# Patient Record
Sex: Female | Born: 1955 | Race: White | Hispanic: No | Marital: Married | Smoking: Never smoker
Health system: Southern US, Community
[De-identification: ages and names within clinical notes are randomized; demographics above are authoritative.]

## PROBLEM LIST (undated history)

## (undated) DIAGNOSIS — M8580 Other specified disorders of bone density and structure, unspecified site: Secondary | ICD-10-CM

## (undated) DIAGNOSIS — Z853 Personal history of malignant neoplasm of breast: Secondary | ICD-10-CM

## (undated) DIAGNOSIS — N879 Dysplasia of cervix uteri, unspecified: Secondary | ICD-10-CM

## (undated) DIAGNOSIS — C801 Malignant (primary) neoplasm, unspecified: Secondary | ICD-10-CM

## (undated) DIAGNOSIS — J301 Allergic rhinitis due to pollen: Secondary | ICD-10-CM

## (undated) DIAGNOSIS — Z85850 Personal history of malignant neoplasm of thyroid: Secondary | ICD-10-CM

## (undated) HISTORY — DX: Allergic rhinitis due to pollen: J30.1

## (undated) HISTORY — DX: Other specified disorders of bone density and structure, unspecified site: M85.80

## (undated) HISTORY — DX: Personal history of malignant neoplasm of thyroid: Z85.850

## (undated) HISTORY — DX: Dysplasia of cervix uteri, unspecified: N87.9

## (undated) HISTORY — PX: DILATION AND CURETTAGE OF UTERUS: SHX78

## (undated) HISTORY — DX: Personal history of malignant neoplasm of breast: Z85.3

## (undated) HISTORY — PX: NECK SURGERY: SHX720

---

## 1999-09-26 ENCOUNTER — Other Ambulatory Visit: Admission: RE | Admit: 1999-09-26 | Discharge: 1999-09-26 | Payer: Self-pay | Admitting: *Deleted

## 1999-10-05 ENCOUNTER — Encounter: Payer: Self-pay | Admitting: *Deleted

## 1999-10-05 ENCOUNTER — Encounter: Admission: RE | Admit: 1999-10-05 | Discharge: 1999-10-05 | Payer: Self-pay | Admitting: *Deleted

## 2000-10-03 ENCOUNTER — Other Ambulatory Visit: Admission: RE | Admit: 2000-10-03 | Discharge: 2000-10-03 | Payer: Self-pay | Admitting: *Deleted

## 2002-05-21 ENCOUNTER — Encounter: Admission: RE | Admit: 2002-05-21 | Discharge: 2002-05-21 | Payer: Self-pay | Admitting: Radiation Oncology

## 2002-10-09 ENCOUNTER — Other Ambulatory Visit: Admission: RE | Admit: 2002-10-09 | Discharge: 2002-10-09 | Payer: Self-pay | Admitting: Family Medicine

## 2003-05-05 ENCOUNTER — Other Ambulatory Visit: Admission: RE | Admit: 2003-05-05 | Discharge: 2003-05-05 | Payer: Self-pay | Admitting: Family Medicine

## 2003-11-11 ENCOUNTER — Other Ambulatory Visit: Admission: RE | Admit: 2003-11-11 | Discharge: 2003-11-11 | Payer: Self-pay | Admitting: Family Medicine

## 2004-03-10 ENCOUNTER — Encounter: Admission: RE | Admit: 2004-03-10 | Discharge: 2004-03-10 | Payer: Self-pay | Admitting: Family Medicine

## 2005-01-19 ENCOUNTER — Other Ambulatory Visit: Admission: RE | Admit: 2005-01-19 | Discharge: 2005-01-19 | Payer: Self-pay | Admitting: Family Medicine

## 2005-02-12 HISTORY — PX: THYROIDECTOMY: SHX17

## 2005-04-26 DIAGNOSIS — N879 Dysplasia of cervix uteri, unspecified: Secondary | ICD-10-CM

## 2005-04-26 HISTORY — DX: Dysplasia of cervix uteri, unspecified: N87.9

## 2005-05-21 ENCOUNTER — Encounter: Admission: RE | Admit: 2005-05-21 | Discharge: 2005-05-21 | Payer: Self-pay | Admitting: Family Medicine

## 2005-05-23 ENCOUNTER — Ambulatory Visit (HOSPITAL_COMMUNITY): Admission: RE | Admit: 2005-05-23 | Discharge: 2005-05-23 | Payer: Self-pay | Admitting: Family Medicine

## 2005-06-05 ENCOUNTER — Encounter (HOSPITAL_COMMUNITY): Admission: RE | Admit: 2005-06-05 | Discharge: 2005-09-03 | Payer: Self-pay | Admitting: Family Medicine

## 2005-06-11 ENCOUNTER — Emergency Department (HOSPITAL_COMMUNITY): Admission: EM | Admit: 2005-06-11 | Discharge: 2005-06-11 | Payer: Self-pay | Admitting: Emergency Medicine

## 2005-08-20 ENCOUNTER — Other Ambulatory Visit: Admission: RE | Admit: 2005-08-20 | Discharge: 2005-08-20 | Payer: Self-pay | Admitting: Family Medicine

## 2005-10-12 ENCOUNTER — Encounter: Admission: RE | Admit: 2005-10-12 | Discharge: 2005-10-12 | Payer: Self-pay | Admitting: Otolaryngology

## 2005-10-22 ENCOUNTER — Other Ambulatory Visit: Admission: RE | Admit: 2005-10-22 | Discharge: 2005-10-22 | Payer: Self-pay | Admitting: Otolaryngology

## 2006-02-20 ENCOUNTER — Other Ambulatory Visit: Admission: RE | Admit: 2006-02-20 | Discharge: 2006-02-20 | Payer: Self-pay | Admitting: Family Medicine

## 2006-05-22 ENCOUNTER — Ambulatory Visit (HOSPITAL_COMMUNITY): Admission: RE | Admit: 2006-05-22 | Discharge: 2006-05-22 | Payer: Self-pay | Admitting: Obstetrics and Gynecology

## 2006-06-17 ENCOUNTER — Encounter: Admission: RE | Admit: 2006-06-17 | Discharge: 2006-06-17 | Payer: Self-pay | Admitting: Family Medicine

## 2006-10-02 ENCOUNTER — Encounter (INDEPENDENT_AMBULATORY_CARE_PROVIDER_SITE_OTHER): Payer: Self-pay | Admitting: Obstetrics and Gynecology

## 2006-10-02 ENCOUNTER — Ambulatory Visit (HOSPITAL_COMMUNITY): Admission: RE | Admit: 2006-10-02 | Discharge: 2006-10-02 | Payer: Self-pay | Admitting: Obstetrics and Gynecology

## 2007-01-30 ENCOUNTER — Other Ambulatory Visit: Admission: RE | Admit: 2007-01-30 | Discharge: 2007-01-30 | Payer: Self-pay | Admitting: Obstetrics and Gynecology

## 2007-06-26 ENCOUNTER — Other Ambulatory Visit: Admission: RE | Admit: 2007-06-26 | Discharge: 2007-06-26 | Payer: Self-pay | Admitting: Obstetrics and Gynecology

## 2007-06-26 ENCOUNTER — Encounter: Admission: RE | Admit: 2007-06-26 | Discharge: 2007-06-26 | Payer: Self-pay | Admitting: Family Medicine

## 2007-07-04 ENCOUNTER — Encounter: Admission: RE | Admit: 2007-07-04 | Discharge: 2007-07-04 | Payer: Self-pay | Admitting: Family Medicine

## 2007-10-28 ENCOUNTER — Other Ambulatory Visit: Admission: RE | Admit: 2007-10-28 | Discharge: 2007-10-28 | Payer: Self-pay | Admitting: Obstetrics and Gynecology

## 2008-04-30 ENCOUNTER — Other Ambulatory Visit: Admission: RE | Admit: 2008-04-30 | Discharge: 2008-04-30 | Payer: Self-pay | Admitting: Obstetrics and Gynecology

## 2009-02-12 DIAGNOSIS — Z853 Personal history of malignant neoplasm of breast: Secondary | ICD-10-CM

## 2009-02-12 HISTORY — DX: Personal history of malignant neoplasm of breast: Z85.3

## 2009-02-12 HISTORY — PX: MASTECTOMY: SHX3

## 2009-02-15 ENCOUNTER — Encounter: Admission: RE | Admit: 2009-02-15 | Discharge: 2009-02-15 | Payer: Self-pay | Admitting: Obstetrics and Gynecology

## 2009-02-25 ENCOUNTER — Encounter: Admission: RE | Admit: 2009-02-25 | Discharge: 2009-02-25 | Payer: Self-pay | Admitting: Obstetrics and Gynecology

## 2009-03-03 ENCOUNTER — Ambulatory Visit: Payer: Self-pay | Admitting: Oncology

## 2009-03-04 ENCOUNTER — Encounter: Admission: RE | Admit: 2009-03-04 | Discharge: 2009-03-04 | Payer: Self-pay | Admitting: Obstetrics and Gynecology

## 2009-03-07 LAB — COMPREHENSIVE METABOLIC PANEL
ALT: 13 U/L (ref 0–35)
Albumin: 4.5 g/dL (ref 3.5–5.2)
BUN: 12 mg/dL (ref 6–23)
CO2: 26 mEq/L (ref 19–32)
Calcium: 9.9 mg/dL (ref 8.4–10.5)
Creatinine, Ser: 0.88 mg/dL (ref 0.40–1.20)
Total Bilirubin: 0.5 mg/dL (ref 0.3–1.2)
Total Protein: 7.1 g/dL (ref 6.0–8.3)

## 2009-03-07 LAB — CBC WITH DIFFERENTIAL/PLATELET
BASO%: 0.3 % (ref 0.0–2.0)
HCT: 37.4 % (ref 34.8–46.6)
HGB: 12.7 g/dL (ref 11.6–15.9)
LYMPH%: 25 % (ref 14.0–49.7)
MCHC: 33.9 g/dL (ref 31.5–36.0)
MCV: 94.7 fL (ref 79.5–101.0)
MONO#: 0.5 10*3/uL (ref 0.1–0.9)
MONO%: 8.9 % (ref 0.0–14.0)
RBC: 3.95 10*6/uL (ref 3.70–5.45)
WBC: 6.1 10*3/uL (ref 3.9–10.3)

## 2010-01-02 ENCOUNTER — Encounter: Admission: RE | Admit: 2010-01-02 | Discharge: 2010-02-02 | Payer: Self-pay | Source: Home / Self Care

## 2010-02-12 HISTORY — PX: BREAST RECONSTRUCTION: SHX9

## 2010-02-15 DIAGNOSIS — M8580 Other specified disorders of bone density and structure, unspecified site: Secondary | ICD-10-CM

## 2010-02-15 HISTORY — DX: Other specified disorders of bone density and structure, unspecified site: M85.80

## 2010-02-17 ENCOUNTER — Encounter
Admission: RE | Admit: 2010-02-17 | Discharge: 2010-02-17 | Payer: Self-pay | Source: Home / Self Care | Attending: Orthopedic Surgery | Admitting: Orthopedic Surgery

## 2010-03-05 ENCOUNTER — Encounter: Payer: Self-pay | Admitting: Obstetrics and Gynecology

## 2010-06-27 NOTE — H&P (Signed)
NAME:  Mia Hebert, Mia Hebert NO.:  [id number]   MEDICAL RECORD NO.:  [id number]          PATIENT TYPE:  AMB   LOCATION:  SDC                           FACILITY:  [facility]   PHYSICIAN:  Artist Pais, M.D.    DATE OF BIRTH:  [birth year]   DATE OF ADMISSION:  DATE OF DISCHARGE:                              HISTORY & PHYSICAL   CURRENT HISTORY:  The patient is a 55 year old gravida 3 para 3  Caucasian female who is admitted for a D&C hysteroscopy.  We did obtain  surgical clearance for the [facility] hysteroscopy from her primary care  physician, Dr. Laurann Montana.  D&C hysteroscopy is being performed  because the patient was found to have endometrial polyps on  sonohysterogram.  She presented in March, referred as a courtesy from  Dr. Cliffton Asters, because she had been amenorrheic since December.  Prior to  this she had had 2 episodes where she would bleed for 1 day and then  stop.  Prior to this period of time, though, her cycles were quite  regular, such that they were every 28 days with a 5-day duration of  flow.  She does have a history of thyroid cancer with a thyroidectomy  and radioactive iodine treatment but contends that her thyroid is well  replaced.  Given the history of her amenorrhea and some oligomenorrhea  prior to that time, I asked her to return for a pelvic ultrasound.  She  was found to have a hyperechoic area within the endometrium suspicious  for an endometrial polyp.  She subsequently underwent a sonohysterogram  which showed 2 hyperechoic masses, one on the anterior wall measuring  1.6 x 1.1 cm and one on the fundal wall measuring 1.8 x 0.9 cm.  The  patient was made aware that she needed to undergo a D&C hysteroscopy to  sample these polyps.  Of course we would try to remove them in entirety  but the purpose of D&C hysteroscopy is to obtain a biopsy of these  polyps to rule out endometrial hyperplasia or endometrial cancer.  Risks  of surgery including anesthetic  complications, hemorrhage and infection,  damage to adjacent structures including bladder, bowel, both associated  ureters were discussed with the patient.  She was made of the risk of  uterine perforation which could result in overwhelming, life-threatening  hemorrhage requiring emergent hysterectomy, or uterine perforation which  could result in bowel damage requiring an emergent colostomy or which  could result in overwhelming life-threatening peritonitis.  She  expressed understanding of and acceptance of these risks.  Furthermore,  I made her aware that sometimes we are unable to completely dilate the  cervix to accommodate the hysteroscope and in light of that we would  certainly try to do the curettage to obtain tissue.  Sometimes the  cervix is so closed that we must obtain tissue via an endometrial  biopsy.  She was also made aware that we will attempt to remove the  polyps in their entirety but sometimes this is not possible and the  purpose is of course to obtain a  biopsy of the polyps.  She expresses  understanding of and acceptance of this.  She is made aware these are  sizeable polyps on a broad base and this may make it more difficult to  remove them in their entirety.  She expresses understanding of and  acceptance of this.   PAST OB-GYN HISTORY:  Menarche at age 65, contraception of discectomy,  cycle interval duration as above.  STD:  The patient has a history of  HPV.  She does not have a history of PID.  The patient does have a  history of abnormal Paps previously in Dr. Lucilla Lame office.  We did  obtain copies of these Paps.  Her most recent Pap was negative, but she  did have an ASCUS Pap last year in July with harvest HPV detected.  She  also had an ASCUS Pap 01/19/2005 and 11/11/2003 and so with harvest HPV  detected on 11/11/2003, she does understand that she will need to have a  colposcopy after she has recovered from her D&C hysteroscopy.   PAST MEDICAL HISTORY:   1. 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      She sees Dr. Beverely Pace at [facility] for this.  Her thyroglobulin      is not decreasing and she did have a recent negative PET scan.  2. She was also found to have polyps in her stomach recently as well.   ALLERGIES:  SULFA causes rash.   CURRENT MEDICATIONS:  Synthroid.   SURGERIES:  Thyroidectomy and removal of coccyx.   PHYSICAL EXAMINATION:  GENERAL APPEARANCE:  Well-developed Caucasian  female.  Blood pressure 140/78, heart rate 80, weight 109.  HEENT:  Normal, without thyromegaly, adenopathy, or nodules.  LUNGS:  Clear to auscultation.  CARDIAC:  Regular rate and rhythm.  ABDOMEN:  Soft and nontender, no hepatosplenomegaly or masses.  EXTREMITIES:  No cyanosis, clubbing or edema.  NEUROLOGIC:  Oriented x3.  PELVIC:  Grossly normal pelvic, normal external female genitalia, no  volar or vaginal or cervical lesions.  The patient has a normal-  appearing vagina with normal vagina rugae.  There is no abnormal vaginal  discharge.  BIMANUAL EXAM:  Reveals the uterus to be normal, mobile and nontender,  without any adnexal mass palpated.  RECTAL:  Excellent sphincter time, confirms pelvic exam, no masses  palpated.   IMPRESSION AND PLAN:  The patient is a 55 year old gravida 3 para 43  Caucasian female admitted for D&C hysteroscopy and polypectomy for 2  large polyps found on sonohysterogram.  It should be noted that I did  have to cancel this case and reschedule it because the patient had taken  some ibuprofen just prior to her preop visit on 09/02/2006 and said  because she had just taken multiple doses of this, with the risk of  bleeding I felt it important to go ahead and just reschedule the  surgery.  She knows that she is not to take any blood thinner  medications 2 weeks prior to surgery.  The patient does also not have a  heart murmur and does not require antibiotics for dental procedures.            ______________________________  Artist Pais, M.D.     DC/MEDQ  D:  10/01/2006  T:  10/01/2006  Job:  [id number]   cc:   Day Surgery, [facility]

## 2010-06-27 NOTE — Op Note (Signed)
Mia Hebert, Mia Hebert NO.:  [id number]   MEDICAL RECORD NO.:  [id number]          PATIENT TYPE:  AMB   LOCATION:  SDC                           [facility]ILITY:  [facility]   PHYSICIAN:  Artist Pais, M.D.    DATE OF BIRTH:  May 11, 1955   DATE OF PROCEDURE:  10/02/2006  DATE OF DISCHARGE:                               OPERATIVE REPORT   PREOPERATIVE DIAGNOSIS:  Endometrial polyps on sonohistogram, one  fundal, one anterior.   POSTOPERATIVE DIAGNOSIS:  Endometrial polyps on sonohistogram, one  fundal, one anterior, a third polyp found on the posterior endometrium  adjacent to the other two polyps.   PROCEDURE:  Dilatation and curettage, hysteroscopy and polypectomy.   SURGEON:  Artist Pais, M.D., PhD   ASSISTANT:  None.   ANESTHESIA:  General by mask.   FINDINGS:  Three endometrial polyps sent to pathology.   ESTIMATED BLOOD LOSS:  Minimal.   FLUIDS:  Approximately 1000 mL of crystalloid.   COMPLICATIONS:  None.   DESCRIPTION OF PROCEDURE:  The patient was brought to the operating  room, identified on the operating room table.  After induction of  adequate general anesthesia by mask the patient was placed in the dorsal  lithotomy position and prepped and draped in the usual sterile fashion.  The bladder was straight cathed for approximately 20 mL of clear yellow  urine.  The patient had urinated prior to coming to the operating room.  A bimanual examination revealed the uterus to be mobile, normal and  anteverted about 6 weeks size without any adnexal mass palpated.  Subsequently the speculum was placed and the anterior lip of the cervix  was infiltrated with 2 mL of 1% Lidocaine.  The remaining 18 mL were  placed for paracervical block.   Subsequently the anterior lip of the cervix was grasped with a single  tooth tenaculum and the cervix was very gently dilated up to a #3 Pratt  dilator.  The scope was introduced through the cervix, but I was unable  to place  the scope past an area in the cervix which appeared to impede  the placement of the scope.  I subsequently dilated the patient up to a  #25 dilator and was easily able to place the Storz hysteroscope into the  endometrial cavity.  A careful and thorough hysteroscopic evaluation was  performed.  The left tubal ostia was visualized.  I was able to see the  area of the right tubal ostium, however, one of the polyps was obscuring  it.  We had seen two polyps on sonohistogram, but there was found to be  a third one at the posterior endometrium.  All of these appeared to be  on somewhat broad bases.  No other pathology was visualized.   The scope was subsequently withdrawn and it should be noted that  Sorbitol was used as the distending medium.  The serrated curet was  placed and a careful curettage was performed in a systematic clock-wise  fashion.  Subsequently I used the Randall-Stone forceps to remove  polyps.  Copious tissue was obtained.  I again curetted the endometrium  with additional polypoid tissue obtained.  The hysteroscope was again  placed to ensure complete removal of the polyps and both of the  originally visualized polyps on sonohistography were noted to have been  removed in their entirety.  However, the polyp in the posterior  endometrium was still present.   Subsequently the scope was withdrawn and a serrated curet was used to  curet across the polyp. I then placed the Randall-Stone polyp forceps in  and was able to remove this last remaining polyp in its entirety.  All  of the tissue and polyps were sent to pathology for examination.  I  subsequently placed the hysteroscope once again and careful evaluation  of the endometrial cavity revealed all polyps to have been removed in  their entirety.  There was noted to be no bleeding.  At that point the  procedure was then terminated.   The tenaculum was removed. There was noted to be minimal bleeding from  the tenaculum site  and so I did hold pressure on the tenaculum sites  until there was noted to be no bleeding.  It should be noted that during  the course of the hysteroscopy I actually kept the outflow valve closed  in order to facilitate placing the scope and visualizing the endometrial  cavity and subsequently as a result there was noted to be copious  spillage of the Sorbitol all over the operating room floor so the  deficit was about 100 mL.  There was Sorbitol which extended almost up  to the CRNA.  However, upon removal of the scope I did attempt to remove  any fluid within the endometrial cavity and the posterior vaginal  fornix.   After again assuring that there was no bleeding from the tenaculum site  the procedure was then terminated.  The patient tolerated the procedure  well without apparent complications.  Was transferred to the recovery  room in stable condition after all instrument, sponge and needle counts  were correct.  The patient was given a postop D&C instruction sheet,  urged to call if any problems.  She has been instructed to take 400-600  mg of Ibuprofen every 6 hours as needed for pain.  I did ask her today  in the preop area if she would like to have a prescription for anything  stronger which she declines. She is urged to call if she has heavy  bleeding so heavy that she soaks a large pad every hour for 3 straight  hours or any problems.  She will return in 2-3 weeks for a postoperative  evaluation and to review her pathology report and plan any treatment.           ______________________________  Artist Pais, M.D.     DC/MEDQ  D:  10/02/2006  T:  10/03/2006  Job:  [id number]   cc:   Artist Pais, M.D.  Fax: [phone]   [place]. Cliffton Asters, M.D.  Fax: [phone]

## 2010-11-24 LAB — CBC
HCT: 33.6 — ABNORMAL LOW
Hemoglobin: 11.7 — ABNORMAL LOW
MCHC: 34.9
Platelets: 307

## 2010-11-24 LAB — COMPREHENSIVE METABOLIC PANEL
ALT: 17
Alkaline Phosphatase: 44
Calcium: 9.2
Chloride: 107
GFR calc Af Amer: >60
Glucose, Bld: 94
Potassium: 3.9

## 2011-06-04 ENCOUNTER — Other Ambulatory Visit: Payer: Self-pay | Admitting: Dermatology

## 2011-11-01 ENCOUNTER — Encounter: Payer: Self-pay | Admitting: Family Medicine

## 2011-11-01 ENCOUNTER — Ambulatory Visit (INDEPENDENT_AMBULATORY_CARE_PROVIDER_SITE_OTHER): Payer: 59 | Admitting: Family Medicine

## 2011-11-01 ENCOUNTER — Other Ambulatory Visit: Payer: Self-pay | Admitting: Family Medicine

## 2011-11-01 ENCOUNTER — Ambulatory Visit (INDEPENDENT_AMBULATORY_CARE_PROVIDER_SITE_OTHER)
Admission: RE | Admit: 2011-11-01 | Discharge: 2011-11-01 | Disposition: A | Discharge status: Home / Self Care | Payer: 59 | Source: Ambulatory Visit | Attending: Family Medicine | Admitting: Family Medicine

## 2011-11-01 VITALS — BP 149/80 | HR 69 | Temp 97.2°F | Ht 65.0 in | Wt 120.0 lb

## 2011-11-01 DIAGNOSIS — S93336A Other dislocation of unspecified foot, initial encounter: Secondary | ICD-10-CM

## 2011-11-01 DIAGNOSIS — S93106A Unspecified dislocation of unspecified toe(s), initial encounter: Secondary | ICD-10-CM

## 2011-11-01 DIAGNOSIS — Z23 Encounter for immunization: Secondary | ICD-10-CM
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
## 2011-11-01 NOTE — Assessment & Plan Note (Addendum)
Left foot 5th toe--it has been relocated by pt.  Will x-ray this today to r/o fracture, fit her with post-op shoe and buddy tape to 4th toe. She'll continue bid NSAID OTC with food.

## 2011-12-10 ENCOUNTER — Encounter: Payer: Self-pay | Admitting: Family Medicine

## 2011-12-10 ENCOUNTER — Ambulatory Visit (INDEPENDENT_AMBULATORY_CARE_PROVIDER_SITE_OTHER): Payer: 59 | Admitting: Family Medicine

## 2011-12-10 VITALS — BP 137/85 | HR 85 | Temp 99.2°F | Ht 65.0 in | Wt 121.1 lb

## 2011-12-10 DIAGNOSIS — J069 Acute upper respiratory infection, unspecified: Secondary | ICD-10-CM

## 2011-12-10 NOTE — Progress Notes (Signed)
OFFICE NOTE  12/10/2011  CC:  Chief Complaint  Patient presents with  . Sore Throat    scratchy, neck sore X 2 days     HPI: Patient is a 56 y.o. Caucasian female who is here for scratchy throat, sore neck on left side. Onset yesterday, nasal drainage, PND, plus the above sx's.  Mild "chilled" feeling but no fever.   Achy in neck, primarily left side where she can feel a lymph node, but no other body aches.  No rash.  No HA. Minimal coughing.  Pertinent PMH:  Past Medical History  Diagnosis Date  . Thyroid cancer 2007, 2008  . HX: breast cancer 2011    Mastectomy and reconstruction 2011-12    MEDS:  Outpatient Prescriptions Prior to Visit  Medication Sig Dispense Refill  . calcium-vitamin D (OSCAL WITH D) 500-200 MG-UNIT per tablet Take 1 tablet by mouth daily.      Marland Kitchen SYNTHROID 88 MCG tablet Take 1 tablet by mouth Daily.      . tamoxifen (NOLVADEX) 20 MG tablet Take 1 tablet by mouth Daily.      Marland Kitchen venlafaxine (EFFEXOR) 37.5 MG tablet Take 18.75 tablets by mouth Daily.         PE: Blood pressure 137/85, pulse 85, temperature 99.2 F (37.3 C), temperature source Temporal, height 5\' 5"  (1.651 m), weight 121 lb 1.9 oz (54.94 kg), SpO2 99.00%. VS: noted--normal. Gen: alert, NAD, NONTOXIC APPEARING. HEENT: eyes without injection, drainage, or swelling.  Ears: EACs clear, TMs with normal light reflex and landmarks.  Nose: Clear rhinorrhea, with some dried, crusty exudate adherent to mildly injected mucosa.  No purulent d/c.  No paranasal sinus TTP.  No facial swelling.  Throat and mouth without focal lesion.  No pharyngial swelling, erythema, or exudate.   Neck: supple, no LAD.   LUNGS: CTA bilat, nonlabored resps.   CV: RRR, no m/r/g. EXT: no c/c/e SKIN: no rash  LAB: Rapid strep NEG  IMPRESSION AND PLAN:  URI (upper respiratory infection) Self-limited nature of this illness was discussed, questions answered.  Discussed symptomatic care, rest, fluids.   Warning  signs/symptoms of worsening illness were discussed.  Patient instructed to call or return if any of these occur. Samples of Norel AD were given, instructions given: take 1 tab q4h prn  Group A strep probe sent to lab today.  An After Visit Summary was printed and given to the patient.  FOLLOW UP: prn

## 2011-12-10 NOTE — Assessment & Plan Note (Signed)
Self-limited nature of this illness was discussed, questions answered.  Discussed symptomatic care, rest, fluids.   Warning signs/symptoms of worsening illness were discussed.  Patient instructed to call or return if any of these occur. Samples of Norel AD were given, instructions given: take 1 tab q4h prn

## 2011-12-13 LAB — CULTURE, GROUP A STREP

## 2011-12-23 ENCOUNTER — Encounter: Payer: Self-pay | Admitting: Family Medicine

## 2012-07-16 ENCOUNTER — Other Ambulatory Visit: Payer: Self-pay | Admitting: Dermatology

## 2012-09-17 ENCOUNTER — Other Ambulatory Visit (HOSPITAL_COMMUNITY)
Admission: RE | Admit: 2012-09-17 | Discharge: 2012-09-17 | Disposition: A | Discharge status: Home / Self Care | Payer: BC Managed Care – PPO | Source: Ambulatory Visit | Attending: Obstetrics and Gynecology | Admitting: Obstetrics and Gynecology

## 2012-09-17 ENCOUNTER — Other Ambulatory Visit: Payer: Self-pay | Admitting: Obstetrics and Gynecology

## 2012-09-17 DIAGNOSIS — Z1151 Encounter for screening for human papillomavirus (HPV): Secondary | ICD-10-CM | POA: Insufficient documentation

## 2012-09-17 DIAGNOSIS — Z01419 Encounter for gynecological examination (general) (routine) without abnormal findings: Secondary | ICD-10-CM | POA: Insufficient documentation

## 2012-10-10 ENCOUNTER — Other Ambulatory Visit: Payer: Self-pay | Admitting: Dermatology

## 2012-11-10 ENCOUNTER — Encounter (HOSPITAL_COMMUNITY): Payer: Self-pay | Admitting: Obstetrics and Gynecology

## 2012-11-20 ENCOUNTER — Encounter (HOSPITAL_COMMUNITY): Payer: Self-pay | Admitting: Pharmacist

## 2012-11-24 ENCOUNTER — Ambulatory Visit (HOSPITAL_COMMUNITY): Payer: BC Managed Care – PPO | Admitting: Anesthesiology

## 2012-11-24 ENCOUNTER — Encounter (HOSPITAL_COMMUNITY)
Admission: RE | Disposition: A | Discharge status: Home / Self Care | Payer: Self-pay | Source: Ambulatory Visit | Attending: Obstetrics and Gynecology

## 2012-11-24 ENCOUNTER — Ambulatory Visit (HOSPITAL_COMMUNITY)
Admission: RE | Admit: 2012-11-24 | Discharge: 2012-11-24 | Disposition: A | Discharge status: Home / Self Care | Payer: BC Managed Care – PPO | Source: Ambulatory Visit | Attending: Obstetrics and Gynecology | Admitting: Obstetrics and Gynecology

## 2012-11-24 ENCOUNTER — Encounter (HOSPITAL_COMMUNITY): Payer: BC Managed Care – PPO | Admitting: Anesthesiology

## 2012-11-24 ENCOUNTER — Encounter (HOSPITAL_COMMUNITY): Payer: Self-pay | Admitting: *Deleted

## 2012-11-24 DIAGNOSIS — Z9889 Other specified postprocedural states: Secondary | ICD-10-CM

## 2012-11-24 DIAGNOSIS — N882 Stricture and stenosis of cervix uteri: Secondary | ICD-10-CM | POA: Insufficient documentation

## 2012-11-24 DIAGNOSIS — R9389 Abnormal findings on diagnostic imaging of other specified body structures: Secondary | ICD-10-CM | POA: Insufficient documentation

## 2012-11-24 HISTORY — DX: Malignant (primary) neoplasm, unspecified: C80.1

## 2012-11-24 HISTORY — PX: DILATION AND CURETTAGE OF UTERUS: SHX78

## 2012-11-24 LAB — CBC
MCH: 29.8 pg (ref 26.0–34.0)
MCHC: 33.2 g/dL (ref 30.0–36.0)
MCV: 90 fL (ref 78.0–100.0)
Platelets: 222 10*3/uL (ref 150–400)
RBC: 4.09 MIL/uL (ref 3.87–5.11)

## 2012-11-24 SURGERY — DILATION AND CURETTAGE
Anesthesia: Monitor Anesthesia Care | Wound class: Clean Contaminated

## 2012-11-24 MED ORDER — SCOPOLAMINE 1 MG/3DAYS TD PT72
1.0000 | MEDICATED_PATCH | TRANSDERMAL | Status: DC
  Administered 2012-11-24: 1.5 mg via TRANSDERMAL

## 2012-11-24 MED ORDER — SCOPOLAMINE 1 MG/3DAYS TD PT72
MEDICATED_PATCH | TRANSDERMAL | Status: AC
  Filled 2012-11-24: qty 1

## 2012-11-24 MED ORDER — PROPOFOL 10 MG/ML IV EMUL
INTRAVENOUS | Status: AC
  Filled 2012-11-24: qty 40

## 2012-11-24 MED ORDER — PROPOFOL INFUSION 10 MG/ML OPTIME
INTRAVENOUS | Status: DC | PRN
  Administered 2012-11-24: 140 ug/kg/min via INTRAVENOUS

## 2012-11-24 MED ORDER — LACTATED RINGERS IV SOLN
INTRAVENOUS | Status: DC | PRN
  Administered 2012-11-24 (×2): via INTRAVENOUS

## 2012-11-24 MED ORDER — LACTATED RINGERS IV SOLN: INTRAVENOUS | Status: DC

## 2012-11-24 MED ORDER — MIDAZOLAM HCL 2 MG/2ML IJ SOLN
INTRAMUSCULAR | Status: AC
  Filled 2012-11-24: qty 2

## 2012-11-24 MED ORDER — SILVER NITRATE-POT NITRATE 75-25 % EX MISC
CUTANEOUS | Status: AC
  Filled 2012-11-24: qty 1

## 2012-11-24 MED ORDER — DEXAMETHASONE SODIUM PHOSPHATE 10 MG/ML IJ SOLN
INTRAMUSCULAR | Status: DC | PRN
  Administered 2012-11-24: 10 mg via INTRAVENOUS

## 2012-11-24 MED ORDER — FENTANYL CITRATE 0.05 MG/ML IJ SOLN
25.0000 ug | INTRAMUSCULAR | Status: DC | PRN
  Administered 2012-11-24: 25 ug via INTRAVENOUS

## 2012-11-24 MED ORDER — LIDOCAINE HCL (CARDIAC) 20 MG/ML IV SOLN
INTRAVENOUS | Status: AC
  Filled 2012-11-24: qty 5

## 2012-11-24 MED ORDER — LIDOCAINE HCL 2 % IJ SOLN
INTRAMUSCULAR | Status: DC | PRN
  Administered 2012-11-24: 10 mL

## 2012-11-24 MED ORDER — FENTANYL CITRATE 0.05 MG/ML IJ SOLN
INTRAMUSCULAR | Status: AC
  Administered 2012-11-24: 25 ug via INTRAVENOUS
  Filled 2012-11-24: qty 2

## 2012-11-24 MED ORDER — FENTANYL CITRATE 0.05 MG/ML IJ SOLN
INTRAMUSCULAR | Status: DC | PRN
  Administered 2012-11-24: 50 ug via INTRAVENOUS
  Administered 2012-11-24 (×2): 25 ug via INTRAVENOUS

## 2012-11-24 MED ORDER — CEFAZOLIN SODIUM-DEXTROSE 2-3 GM-% IV SOLR
INTRAVENOUS | Status: AC
  Filled 2012-11-24: qty 50

## 2012-11-24 MED ORDER — KETOROLAC TROMETHAMINE 30 MG/ML IJ SOLN
15.0000 mg | Freq: Once | INTRAMUSCULAR | Status: AC | PRN
  Administered 2012-11-24: 30 mg via INTRAVENOUS

## 2012-11-24 MED ORDER — IBUPROFEN 200 MG PO TABS: 600.0000 mg | ORAL_TABLET | Freq: Four times a day (QID) | ORAL | Status: DC | PRN

## 2012-11-24 MED ORDER — METOCLOPRAMIDE HCL 5 MG/ML IJ SOLN: 10.0000 mg | Freq: Once | INTRAMUSCULAR | Status: DC | PRN

## 2012-11-24 MED ORDER — MIDAZOLAM HCL 2 MG/2ML IJ SOLN
INTRAMUSCULAR | Status: DC | PRN
  Administered 2012-11-24 (×2): 1 mg via INTRAVENOUS

## 2012-11-24 MED ORDER — ONDANSETRON HCL 4 MG/2ML IJ SOLN
INTRAMUSCULAR | Status: AC
  Filled 2012-11-24: qty 2

## 2012-11-24 MED ORDER — LIDOCAINE HCL 2 % IJ SOLN
INTRAMUSCULAR | Status: AC
  Filled 2012-11-24: qty 20

## 2012-11-24 MED ORDER — MISOPROSTOL 200 MCG PO TABS
ORAL_TABLET | ORAL | Status: AC
  Filled 2012-11-24: qty 1

## 2012-11-24 MED ORDER — FENTANYL CITRATE 0.05 MG/ML IJ SOLN
INTRAMUSCULAR | Status: AC
  Filled 2012-11-24: qty 2

## 2012-11-24 MED ORDER — ONDANSETRON HCL 4 MG/2ML IJ SOLN
INTRAMUSCULAR | Status: DC | PRN
  Administered 2012-11-24: 4 mg via INTRAMUSCULAR

## 2012-11-24 MED ORDER — CEFAZOLIN SODIUM-DEXTROSE 2-3 GM-% IV SOLR
2.0000 g | INTRAVENOUS | Status: AC
  Administered 2012-11-24: 2 g via INTRAVENOUS

## 2012-11-24 MED ORDER — DEXAMETHASONE SODIUM PHOSPHATE 10 MG/ML IJ SOLN
INTRAMUSCULAR | Status: AC
  Filled 2012-11-24: qty 1

## 2012-11-24 MED ORDER — LIDOCAINE HCL (CARDIAC) 20 MG/ML IV SOLN
INTRAVENOUS | Status: DC | PRN
  Administered 2012-11-24: 40 mg via INTRAVENOUS

## 2012-11-24 MED ORDER — KETOROLAC TROMETHAMINE 30 MG/ML IJ SOLN
INTRAMUSCULAR | Status: AC
  Administered 2012-11-24: 30 mg via INTRAVENOUS
  Filled 2012-11-24: qty 1

## 2012-11-24 SURGICAL SUPPLY — 17 items
CATH ROBINSON RED A/P 16FR (CATHETERS) ×2 IMPLANT
CLOTH BEACON ORANGE TIMEOUT ST (SAFETY) ×2 IMPLANT
CONTAINER PREFILL 10% NBF 60ML (FORM) IMPLANT
DECANTER SPIKE VIAL GLASS SM (MISCELLANEOUS) ×2 IMPLANT
DRESSING TELFA 8X3 (GAUZE/BANDAGES/DRESSINGS) ×2 IMPLANT
GLOVE BIO SURGEON STRL SZ7 (GLOVE) ×2 IMPLANT
GLOVE BIOGEL PI IND STRL 7.0 (GLOVE) ×1 IMPLANT
GLOVE BIOGEL PI INDICATOR 7.0 (GLOVE) ×1
GOWN STRL REIN XL XLG (GOWN DISPOSABLE) ×4 IMPLANT
NDL SPNL 22GX3.5 QUINCKE BK (NEEDLE) ×1 IMPLANT
NEEDLE SPNL 22GX3.5 QUINCKE BK (NEEDLE) ×2 IMPLANT
NS IRRIG 1000ML POUR BTL (IV SOLUTION) ×2 IMPLANT
PACK VAGINAL MINOR WOMEN LF (CUSTOM PROCEDURE TRAY) ×2 IMPLANT
PAD OB MATERNITY 4.3X12.25 (PERSONAL CARE ITEMS) ×2 IMPLANT
PAD PREP 24X48 CUFFED NSTRL (MISCELLANEOUS) ×2 IMPLANT
SYR CONTROL 10ML LL (SYRINGE) ×2 IMPLANT
TOWEL OR 17X24 6PK STRL BLUE (TOWEL DISPOSABLE) ×4 IMPLANT

## 2012-11-24 NOTE — Anesthesia Preprocedure Evaluation (Signed)
Anesthesia Evaluation  Patient identified by MRN, date of birth, ID band Patient awake    Reviewed: Allergy & Precautions, H&P , NPO status , Patient's Chart, lab work & pertinent test results, reviewed documented beta blocker date and time   History of Anesthesia Complications (+) PONV and history of anesthetic complications  Airway Mallampati: II TM Distance: >3 FB Neck ROM: full    Dental  (+) Teeth Intact   Pulmonary neg pulmonary ROS,  breath sounds clear to auscultation  Pulmonary exam normal       Cardiovascular negative cardio ROS  Rhythm:regular Rate:Normal     Neuro/Psych negative neurological ROS  negative psych ROS   GI/Hepatic negative GI ROS, Neg liver ROS,   Endo/Other  Hypothyroidism (s/p thyroidectomy for thyroid cancer)   Renal/GU negative Renal ROS  Female GU complaint     Musculoskeletal   Abdominal   Peds  Hematology S/p mastectomy, chemo and radiation for breast cancer in 2011.  Now on tamoxifen.   Anesthesia Other Findings   Reproductive/Obstetrics negative OB ROS                           Anesthesia Physical Anesthesia Plan  ASA: II  Anesthesia Plan: MAC   Post-op Pain Management:    Induction:   Airway Management Planned:   Additional Equipment:   Intra-op Plan:   Post-operative Plan:   Informed Consent: I have reviewed the patients History and Physical, chart, labs and discussed the procedure including the risks, benefits and alternatives for the proposed anesthesia with the patient or authorized representative who has indicated his/her understanding and acceptance.   Dental Advisory Given  Plan Discussed with: CRNA and Surgeon  Anesthesia Plan Comments:         Anesthesia Quick Evaluation

## 2012-11-24 NOTE — Interval H&P Note (Signed)
History and Physical Interval Note:  11/24/2012 8:55 AM  Mia Hebert  has presented today for surgery, with the diagnosis of Thickened Endometrium CPT 58120  The various methods of treatment have been discussed with the patient and family. After consideration of risks, benefits and other options for treatment, the patient has consented to  Procedure(s): DILATATION AND CURETTAGE (N/A) as a surgical intervention .  The patient's history has been reviewed, patient examined, no change in status, stable for surgery.  I have reviewed the patient's chart and labs.  Questions were answered to the patient's satisfaction.     Dion Body, Erhardt Dada

## 2012-11-24 NOTE — Brief Op Note (Signed)
11/24/2012  9:43 AM  PATIENT:  Mia Hebert  57 y.o. female  PRE-OPERATIVE DIAGNOSIS:  Thickened Endometrium on Tamoxifen, Failed office EMB due to cervical stenosis CPT 58120  POST-OPERATIVE DIAGNOSIS:  Same  PROCEDURE:  Procedure(s): DILATATION AND CURETTAGE (N/A)  SURGEON:  Surgeon(s) and Role:    * Geryl Rankins, MD - Primary  PHYSICIAN ASSISTANT:   ASSISTANTS: Technician   ANESTHESIA:   local and IV sedation  EBL:  Total I/O In: -  Out: 200 [Urine:200]  BLOOD ADMINISTERED:none  DRAINS: none   LOCAL MEDICATIONS USED:  2 % LIDOCAINE, 10 cc  SPECIMEN:  Source of Specimen:  Endometrial currettings  DISPOSITION OF SPECIMEN:  PATHOLOGY  COUNTS:  YES  TOURNIQUET:  * No tourniquets in log *  DICTATION: .Other Dictation: Dictation Number [id number]  PLAN OF CARE: Discharge to home after PACU  PATIENT DISPOSITION:  PACU - hemodynamically stable.   Delay start of Pharmacological VTE agent (>24hrs) due to surgical blood loss or risk of bleeding: not applicable

## 2012-11-24 NOTE — Anesthesia Postprocedure Evaluation (Signed)
  Anesthesia Post-op Note  Anesthesia Post Note  Patient: Mia Hebert  Procedure(s) Performed: Procedure(s) (LRB): DILATATION AND CURETTAGE (N/A)  Anesthesia type: MAC  Patient location: PACU  Post pain: Pain level controlled  Post assessment: Post-op Vital signs reviewed  Last Vitals:  Filed Vitals:   11/24/12 1130  BP: 106/53  Pulse: 62  Temp:   Resp: 18    Post vital signs: Reviewed  Level of consciousness: sedated  Complications: No apparent anesthesia complications

## 2012-11-24 NOTE — H&P (Signed)
History of Present Illness  General:  57 y/o presents for preop for D&C due to thickened endometrium on Tamoxifen. She failed office EMB due to stenosis. She opted to forgo Cytotec placement and second attempt at office EMB. Pt is asymptomatic, denies bleeding. Thickened EMS noted on pelvic ultrasound. Pt currently without complaints.   Current Medications  Taking Synthroid 88 mcg Tablet 1 tablet every morning on an empty stomach Once a day  Taking Calcium 600 + D 600-200 MG-UNIT Tablet 1 tablet Once a day  Taking Tamoxifen Citrate 20 MG Tablet 1 tablet daily  Taking Prolia , Notes: has not had in 1 year  Taking Venlafaxine HCl 37.5 MG Tablet 1/2 tablet once a day  Medication List reviewed and reconciled with the patient   Past Medical History  papillary thyroid carcinoma with metastasis to left lateral compartment, 2007, Dr. 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  lymph nodes removed for recurrence of thyroid cancer 10/08   Family History  Father: deceased 80 yrs, kidney failure, hypertension, heart disease 39, DM, diagnosed with DM, HTN, CAD  Mother: deceased 14 yrs, non-Hodgkin's lymphoma, hypercholesterolemia  Paternal Grand Father: deceased 41s yrs  Paternal Grand Mother: deceased 69s yrs  Maternal Grand Father: deceased 76 yrs, bone cancer  Maternal Grand Mother: deceased 42s yrs  Brother 1: alive, testicular cancer in his early 2s, HTN, diagnosed with HTN   Maternal aunt: Ovarian or Uterine cancer  Daughter(s): daughter--follicular thyroid cancer  1 brother(s) .   No family hx colon polyps, colon cancer or liver disease.   Social History  General:  Tobacco use  cigarettes: Never smoked no Smoking.  no Alcohol.  no Recreational drug use.  Exercise: yes, 2 times week, walking.  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  Marital Status: married, Zenaida Niece, minister--[employer] (hep C, s/p liver transplant with recurrence--now in remission).  Children: 3, girls, Bree --[place], follicular thyroid cancer, Rodman Pickle [place] and [place], 5 grandkids.  Religion: [employer].  Seat belt use: yes.    Gyn History  Sexual activity currently sexually active.  Periods : postmenopausal.  Denies H/O LMP.  Birth control vasectomy.  Last pap smear date 6 months ago.  Denies H/O Last mammogram date.  Abnormal pap smear LGSIL.  STD HPV.  GYN procedures Colposcopy.  Denies H/O Trying to get pregnant.    OB History  Number of pregnancies 3.  Pregnancy # 1 live birth, vaginal delivery, girl.  Pregnancy # 2 live birth, vaginal delivery, girl.  Pregnancy # 3 live birth, vaginal delivery, girl.    Allergies  sulfa: rash  Compazine: jittery  Phenergan: jittery   Hospitalization/Major Diagnostic Procedure  surgery   childbirth 84, 87, 88   Review of Systems  Denies fever/chills, chest pain, SOB, headaches, numbness/tingling. No h/o complication with anesthesia, bleeding disorders or blood clots.   Vital Signs  Wt 119, Wt change 1 lb, Ht 65, BMI 19.80, Pulse sitting 86, BP sitting 114/70.   Physical  Examination  GENERAL:  Patient appears alert and oriented.  General Appearance: well-appearing, well-developed, no acute distress.  Speech: clear.  LUNGS:  Auscultation: no wheezing/rhonchi/rales. CTA bilaterally.  HEART:  Heart sounds: normal. RRR. no murmur.  ABDOMEN:  General: soft nontender,  nondistended, no masses.  FEMALE GENITOURINARY:  Pelvic Not examined.  EXTREMITIES:  General: No edema or calf tenderness.     Assessments   1. Pre-op exam - V72.84 (Primary)   2. Endometrial thickening on ultra sound - 793.5   Treatment  1. Pre-op exam  Notes: R/B/A of procedure discussed with pt at length. All questions answered. Consent obtained.    Follow Up  2 Weeks post op

## 2012-11-24 NOTE — Transfer of Care (Signed)
Immediate Anesthesia Transfer of Care Note  Patient: Mia Hebert  Procedure(s) Performed: Procedure(s): DILATATION AND CURETTAGE (N/A)  Patient Location: PACU  Anesthesia Type:MAC  Level of Consciousness: awake, alert  and oriented  Airway & Oxygen Therapy: Patient Spontanous Breathing and Patient connected to nasal cannula oxygen  Post-op Assessment: Report given to PACU RN and Post -op Vital signs reviewed and stable  Post vital signs: Reviewed and stable  Complications: No apparent anesthesia complications

## 2012-11-25 ENCOUNTER — Encounter (HOSPITAL_COMMUNITY): Payer: Self-pay | Admitting: Obstetrics and Gynecology

## 2012-11-25 NOTE — Op Note (Signed)
NAMEMARICEL, SWARTZENDRUBER NO.:  [id number]  MEDICAL RECORD NO.:  [id number]  LOCATION:  [facility]                          FACILITY:  [facility]  PHYSICIAN:  Pieter Partridge, MD   DATE OF BIRTH:  July 30, 1955  DATE OF PROCEDURE:  11/24/2012 DATE OF DISCHARGE:  11/24/2012                              OPERATIVE REPORT   PREOPERATIVE DIAGNOSIS:  Thickened endometrium, on tamoxifen, failed office EMB due to cervical stenosis.  POSTOPERATIVE DIAGNOSIS:  Thickened endometrium, on tamoxifen, failed office EMB due to cervical stenosis.  PROCEDURE:  Dilation and curettage.  SURGEON:  Pieter Partridge, MD  ASSISTANT:  Technician.  ANESTHESIA:  Local and IV sedation.  ESTIMATED BLOOD LOSS:  Minimal.  URINE OUTPUT:  200 mL at the beginning of the case.  Local with 2% lidocaine, 10 mL total used.  SPECIMEN:  Endometrial curettings to Pathology.  DISPOSITION:  To PACU, hemodynamically stable.  COMPLICATIONS:  None.  FINDINGS:  Stenotic internal os of the cervix.  Uterus sounded to 7 cm. No suspicious appearance to the endometrial curettings.  Atrophic cervix and vagina.  PROCEDURE IN DETAIL:  Ms. Atkerson was identified in the holding area. She was then transported to operating room 4, where she underwent IV sedation.  She was then placed in the dorsal lithotomy position and prepped and draped in a normal sterile fashion.  After a time-out was performed, Graves speculum was inserted into the vagina.  The cervix was identified and a 2-3 mL of 2% lidocaine was injected at the anterior lip of the cervix.  Single-tooth tenaculum was then placed on the anterior lip.  Paracervical block was performed to complete a total of 10 mL.  A 3 Hegar dilator was then used to dilate the endocervix or the endocervical canal.  I attempted to go up to 3.5, but it was not well tolerated, so at that point, I used a plastic finder, which graduated nicely.  I then was able to fit a 5 Hegar dilator.   A sharp curettage was then performed of all 4 quadrants with the serrated and flat curette.  It looked to be just bloody endometrium.  There was no thickening to the tissue and no polypoid tissue seen.  Uterus was sounded to 7 cm gently prior to performing the curettage.  The single-tooth tenaculum was removed from the anterior lip of the cervix.  Hemostasis achieved with silver nitrate.  The cervical os was without active bleeding at the end of the procedure.  All instruments were removed from the vagina.  The patient tolerated the procedure well.  The patient had SCDs throughout the entire procedure.  She had Ancef 2 g IV before the procedure started.  She tolerated the procedure well and was taken to the operating room in stable condition.  All instrument and sponge counts were correct x3.     Pieter Partridge, MD     EBV/MEDQ  D:  11/24/2012  T:  11/25/2012  Job:  [id number]

## 2013-01-02 ENCOUNTER — Other Ambulatory Visit: Payer: Self-pay | Admitting: Dermatology

## 2013-02-03 ENCOUNTER — Telehealth: Payer: Self-pay | Admitting: Family Medicine

## 2013-02-03 MED ORDER — OSELTAMIVIR PHOSPHATE 75 MG PO CAPS: 75.0000 mg | ORAL_CAPSULE | Freq: Every day | ORAL | Status: DC

## 2013-02-03 NOTE — Telephone Encounter (Signed)
Patient came by office to request 10 day script for Prophylactic 1x daily to be called in for her, her husband, and her two daughters. The patients granddaughter has the flu and due to husbands condition the pediatrician is requesting that all family members be put on this medication to help prevent the spread of the flu. All family members are patients here (husband-Vance Charrier, daughters- Summer and Kellen Hover )   They all use [facility] in [place], call patient with questions

## 2013-02-03 NOTE — Telephone Encounter (Signed)
Okay to send tamiflu to pharmacy per Dr. Milinda Cave.

## 2013-04-14 ENCOUNTER — Other Ambulatory Visit: Payer: Self-pay | Admitting: Gastroenterology

## 2013-04-14 DIAGNOSIS — R1310 Dysphagia, unspecified: Secondary | ICD-10-CM

## 2013-04-16 ENCOUNTER — Ambulatory Visit
Admission: RE | Admit: 2013-04-16 | Discharge: 2013-04-16 | Disposition: A | Discharge status: Home / Self Care | Payer: BC Managed Care – PPO | Source: Ambulatory Visit | Attending: Gastroenterology | Admitting: Gastroenterology

## 2013-04-16 DIAGNOSIS — R1310 Dysphagia, unspecified: Secondary | ICD-10-CM

## 2013-12-18 ENCOUNTER — Other Ambulatory Visit: Payer: Self-pay | Admitting: Dermatology

## 2014-01-14 ENCOUNTER — Other Ambulatory Visit: Payer: Self-pay | Admitting: Dermatology

## 2014-11-02 ENCOUNTER — Other Ambulatory Visit: Payer: Self-pay | Admitting: Obstetrics and Gynecology

## 2014-11-02 ENCOUNTER — Other Ambulatory Visit (HOSPITAL_COMMUNITY)
Admission: RE | Admit: 2014-11-02 | Discharge: 2014-11-02 | Disposition: A | Discharge status: Home / Self Care | Payer: BLUE CROSS/BLUE SHIELD | Source: Ambulatory Visit | Attending: Obstetrics and Gynecology | Admitting: Obstetrics and Gynecology

## 2014-11-02 DIAGNOSIS — Z01419 Encounter for gynecological examination (general) (routine) without abnormal findings: Secondary | ICD-10-CM | POA: Insufficient documentation

## 2014-11-04 LAB — CYTOLOGY - PAP

## 2014-11-21 ENCOUNTER — Encounter (HOSPITAL_BASED_OUTPATIENT_CLINIC_OR_DEPARTMENT_OTHER): Payer: Self-pay | Admitting: Emergency Medicine

## 2014-11-21 ENCOUNTER — Emergency Department (HOSPITAL_BASED_OUTPATIENT_CLINIC_OR_DEPARTMENT_OTHER)
Admission: EM | Admit: 2014-11-21 | Discharge: 2014-11-21 | Disposition: A | Discharge status: Home / Self Care | Payer: BLUE CROSS/BLUE SHIELD | Attending: Emergency Medicine | Admitting: Emergency Medicine

## 2014-11-21 DIAGNOSIS — T7840XA Allergy, unspecified, initial encounter: Secondary | ICD-10-CM

## 2014-11-21 DIAGNOSIS — R200 Anesthesia of skin: Secondary | ICD-10-CM | POA: Diagnosis not present

## 2014-11-21 DIAGNOSIS — Y998 Other external cause status: Secondary | ICD-10-CM | POA: Insufficient documentation

## 2014-11-21 DIAGNOSIS — Z853 Personal history of malignant neoplasm of breast: Secondary | ICD-10-CM | POA: Diagnosis not present

## 2014-11-21 DIAGNOSIS — Y9289 Other specified places as the place of occurrence of the external cause: Secondary | ICD-10-CM | POA: Insufficient documentation

## 2014-11-21 DIAGNOSIS — Z8709 Personal history of other diseases of the respiratory system: Secondary | ICD-10-CM | POA: Insufficient documentation

## 2014-11-21 DIAGNOSIS — X58XXXA Exposure to other specified factors, initial encounter: Secondary | ICD-10-CM | POA: Diagnosis not present

## 2014-11-21 DIAGNOSIS — Z85850 Personal history of malignant neoplasm of thyroid: Secondary | ICD-10-CM | POA: Diagnosis not present

## 2014-11-21 DIAGNOSIS — R42 Dizziness and giddiness: Secondary | ICD-10-CM | POA: Insufficient documentation

## 2014-11-21 DIAGNOSIS — M8580 Other specified disorders of bone density and structure, unspecified site: Secondary | ICD-10-CM | POA: Diagnosis not present

## 2014-11-21 DIAGNOSIS — Y9389 Activity, other specified: Secondary | ICD-10-CM | POA: Insufficient documentation

## 2014-11-21 DIAGNOSIS — R21 Rash and other nonspecific skin eruption: Secondary | ICD-10-CM | POA: Diagnosis present

## 2014-11-21 DIAGNOSIS — Z79899 Other long term (current) drug therapy: Secondary | ICD-10-CM | POA: Insufficient documentation

## 2014-11-21 DIAGNOSIS — Z87448 Personal history of other diseases of urinary system: Secondary | ICD-10-CM | POA: Insufficient documentation

## 2014-11-21 DIAGNOSIS — T781XXA Other adverse food reactions, not elsewhere classified, initial encounter: Secondary | ICD-10-CM | POA: Diagnosis not present

## 2014-11-21 MED ORDER — PREDNISONE 50 MG PO TABS
60.0000 mg | ORAL_TABLET | Freq: Once | ORAL | Status: AC
  Administered 2014-11-21: 60 mg via ORAL
  Filled 2014-11-21 (×2): qty 1

## 2014-11-21 MED ORDER — FAMOTIDINE 20 MG PO TABS
20.0000 mg | ORAL_TABLET | Freq: Once | ORAL | Status: AC
  Administered 2014-11-21: 20 mg via ORAL
  Filled 2014-11-21: qty 1

## 2014-11-21 MED ORDER — DIPHENHYDRAMINE HCL 25 MG PO CAPS: 25.0000 mg | ORAL_CAPSULE | Freq: Once | ORAL | Status: DC

## 2014-11-21 MED ORDER — DIPHENHYDRAMINE HCL 25 MG PO CAPS
25.0000 mg | ORAL_CAPSULE | Freq: Once | ORAL | Status: AC
Start: 2014-11-21 — End: 2014-11-21
  Administered 2014-11-21: 25 mg via ORAL
  Filled 2014-11-21: qty 1

## 2014-11-21 NOTE — ED Notes (Signed)
Family at bedside. 

## 2014-11-21 NOTE — ED Notes (Signed)
Pt noted to no longer have redness on face and eye lids, voices no complaints

## 2014-11-21 NOTE — ED Notes (Signed)
MD at bedside. 

## 2014-11-21 NOTE — ED Notes (Signed)
Placed on cont POX monitoring with int NBP monitoring at q36min NBP

## 2014-11-21 NOTE — ED Provider Notes (Signed)
CSN: [id number]     Arrival date & time 11/21/14  1620 History  By signing my name below, I, Helane Gunther, attest that this documentation has been prepared under the direction and in the presence of Virgel Manifold, MD. Electronically Signed: Helane Gunther, ED Scribe. 11/21/2014. 4:50 PM.      Chief Complaint  Patient presents with  . Allergic Reaction   The history is provided by the patient. No language interpreter was used.   HPI Comments: Mia Hebert is a 59 y.o. female with a PMHx of thyroid cancer who presents to the Emergency Department complaining of an allergic reaction onset just PTA. Pt states she made a shake with carrots, kale, apples, and strawberries, all of which she bought at [facility] immediately before this/ She states she drank 1/4th of the shake when her lips began to tingle. She states she took a benadryl which did not relieve the symptoms. When her tongue and lips began to swell and feel numb she states she came to the ED. She reports associated diffuse itchiness, rash to the face, numbness to the lips, nausea (resolved), dizziness and lightheadedness (both resolved), as well as swelling to the lips and tongue. She denies ever having a reaction like this before and notes she has eaten all of these ingredients before. She denies trouble swallowing.  Past Medical History  Diagnosis Date  . History of thyroid cancer 2007, 2008    Papillary thyroid carcinoma s/p radiothyroidectomy.  Recurrence 2008--neck dissection only.  Marland Kitchen HX: breast cancer 02/2009    Ductal carcinoma with lobular features.  Bilateral mastectomy (left breast w/out malignancy) and reconstruction 2011-12.  Chemo + radiation.  . Cervical dysplasia 04/26/2005    conization (CIN II on path, negative margins.  Dr. Polly Cobia).  . Osteopenia 02/15/10    DEXA: FRAX score indicating >25% fracture risk + previous rib fracture.  Prolia initiated 03/14/10.  Marland Kitchen Hay fever   . Cancer [facility])     thyroid cancer   Past Surgical  History  Procedure Laterality Date  . Thyroidectomy  2007    +neck dissection.  Neck dissection again when her cancer recurred in 2008.  . Breast reconstruction  2012  . Mastectomy  2011    Bilateral  . Neck surgery      removed lymph nodes under colar bone - cancer  . Dilation and curettage of uterus    . Dilation and curettage of uterus N/A 11/24/2012    Procedure: DILATATION AND CURETTAGE;  Surgeon: Thurnell Lose, MD;  Location: [facility];  Service: Gynecology;  Laterality: N/A;   Family History  Problem Relation Age of Onset  . Heart disease Mother   . Hypertension Mother   . Diabetes Mother   . Heart disease Father   . Hypertension Father   . Diabetes Father   . Lymphoma Mother   . Ovarian cancer Maternal Aunt    Social History  Substance Use Topics  . Smoking status: Never Smoker   . Smokeless tobacco: Never Used  . Alcohol Use: No   OB History    No data available     Review of Systems  HENT: Positive for facial swelling. Negative for trouble swallowing.   Gastrointestinal: Positive for nausea (resolved).  Skin: Positive for rash.  Neurological: Positive for dizziness, light-headedness and numbness.  All other systems reviewed and are negative.   Allergies  Compazine; Phenergan; and Sulfa antibiotics  Home Medications   Prior to Admission medications   Medication Sig Start  Date End Date Taking? Authorizing Provider  calcium-vitamin D (OSCAL WITH D) 500-200 MG-UNIT per tablet Take 1 tablet by mouth daily.   Yes Historical Provider, MD  letrozole (FEMARA) 2.5 MG tablet Take 2.5 mg by mouth daily.   Yes Historical Provider, MD  levothyroxine (SYNTHROID, LEVOTHROID) 112 MCG tablet Take 112 mcg by mouth daily before breakfast.   Yes Historical Provider, MD  co-enzyme Q-10 30 MG capsule Take 30 mg by mouth daily.    Historical Provider, MD   BP 132/72 mmHg  Pulse 70  Resp 18  SpO2 99% Physical Exam  Constitutional: She is oriented to person, place, and time.  She appears well-developed and well-nourished.  HENT:  Mouth/Throat: Oropharynx is clear and moist.  blotchy red rash periorbitally, mild lip swelling, oropharynx is clear, normal sounding voice, no stridor  Eyes: EOM are normal.  Neck: Normal range of motion.  Pulmonary/Chest: Effort normal.  Abdominal: She exhibits no distension.  Musculoskeletal: Normal range of motion.  Neurological: She is alert and oriented to person, place, and time.  Skin: Rash noted.  Psychiatric: She has a normal mood and affect.  Nursing note and vitals reviewed.   ED Course  Procedures  DIAGNOSTIC STUDIES: Oxygen Saturation is 100% on RA, normal by my interpretation.    COORDINATION OF CARE: 4:48 PM - Discussed plans to order steroids and benadryl and observe for a time. Pt advised of plan for treatment and pt agrees.  Labs Review Labs Reviewed - No data to display  Imaging Review No results found. I have personally reviewed and evaluated these images and lab results as part of my medical decision-making.   EKG Interpretation None      MDM   Final diagnoses:  Allergic reaction, initial encounter   58yF with mild facial swelling/rash. HD stable. No evidence of airway compromise. Appears well. Low suspicion for serious reaction/anaphylaxis. Recommended staying longer for observation after meds but insists on leaving to meet visitors coming in from out of town. Left in NAD.    I personally preformed the services scribed in my presence. The recorded information has been reviewed is accurate. Virgel Manifold, MD.   Virgel Manifold, MD 11/25/14 [phone]

## 2014-11-21 NOTE — ED Notes (Signed)
Denies any rash or itching, denies any N/V or D

## 2014-11-21 NOTE — ED Notes (Signed)
No resp distress noted at this time, speech appears normal, resp appear even and non labored

## 2014-11-21 NOTE — ED Notes (Signed)
Pt was making a  Shake that she has had before and after a few sips she developed lip and tongue swelling,

## 2014-11-21 NOTE — ED Notes (Signed)
Pt's family wanting to be seen fast, requesting im medications, per husband they have kids at home who need to be picked up, explained procedure and need for patient to be seen and evaluated for a while to monitor reaction

## 2014-11-21 NOTE — ED Notes (Signed)
Pt here for allergic reaction.

## 2014-11-21 NOTE — ED Notes (Signed)
Patient drinking juice w/o difficulty

## 2014-12-30 IMAGING — RF DG ESOPHAGUS
16 of 19 series · 19 of 24 positions shown · non-contrast
Comparison: None.

FLUOROSCOPY TIME:  2 min 18 seconds

CLINICAL DATA: Dysphagia, history of prior thyroidectomy for
thyroid carcinoma, as well as mastectomy for breast carcinoma

EXAM:
ESOPHOGRAM / BARIUM SWALLOW / BARIUM TABLET STUDY
TECHNIQUE: Combined double contrast and single contrast examination performed
using effervescent crystals, thick barium liquid, and thin barium
liquid. The patient was observed with fluoroscopy swallowing a 13mm
barium sulphate tablet.

[Series 1: run · 1 of 1 slices shown (1 of 16)]
[im 1/1]
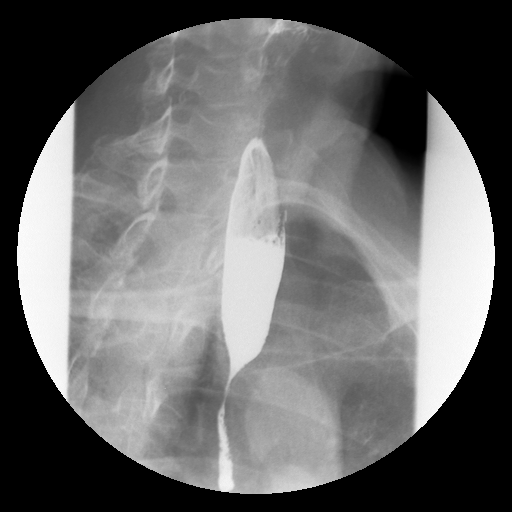

[Series 2: run · 1 of 1 slices shown (2 of 16)]
[im 1/1]
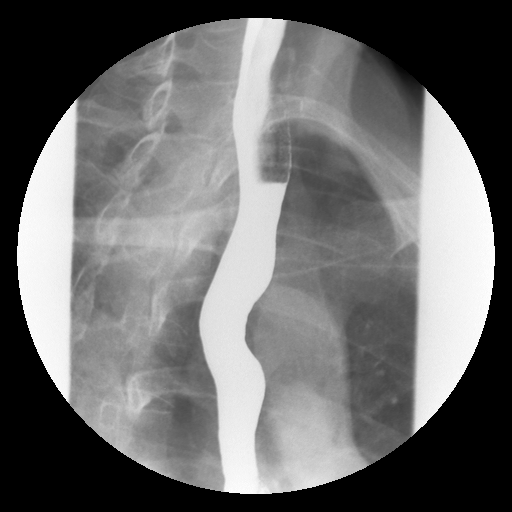

[Series 4: run · 1 of 1 slices shown (3 of 16)]
[im 1/1]
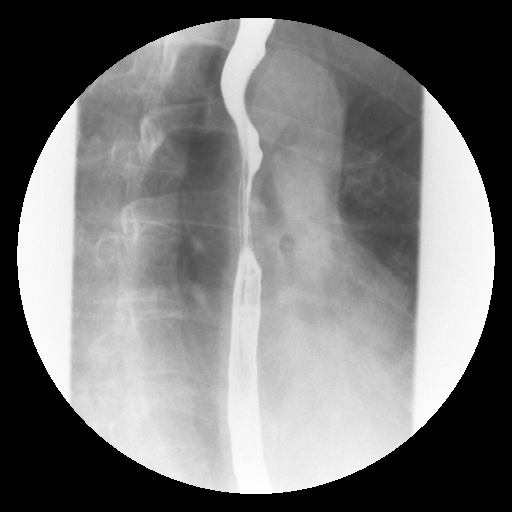

[Series 5: run · 1 of 1 slices shown (4 of 16)]
[im 1/1]
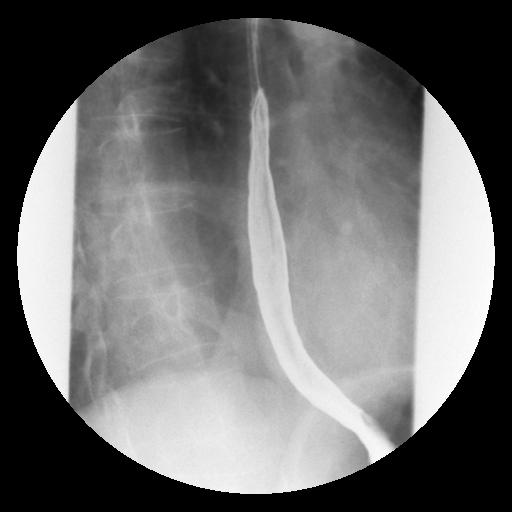

[Series 6: run · 2 of 8 slices shown (5 of 16)]
[im 1/8]
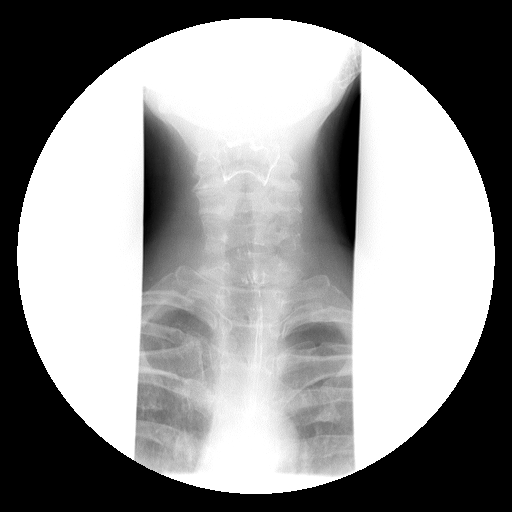
[im 8/8]
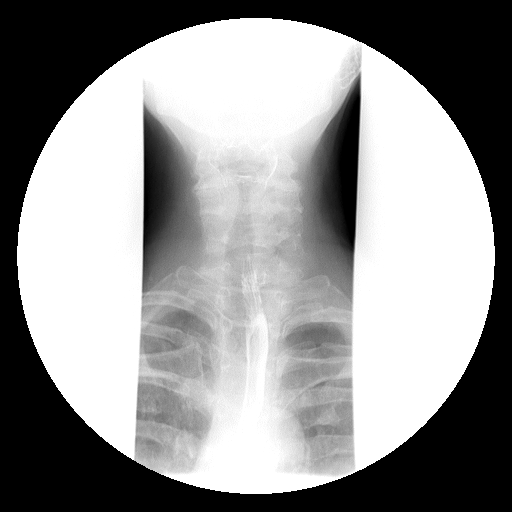

[Series 7: run · 2 of 8 slices shown (6 of 16)]
[im 4/8]
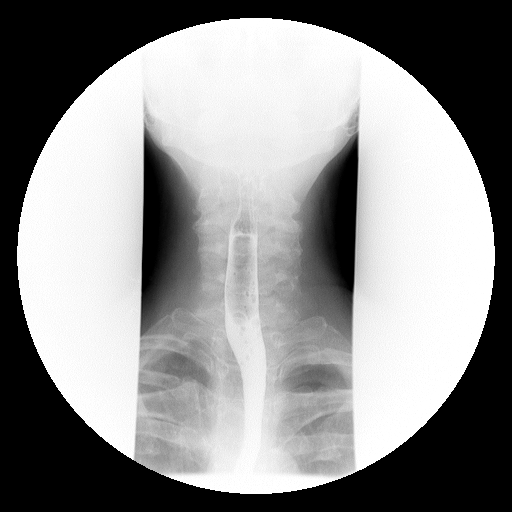
[im 8/8]
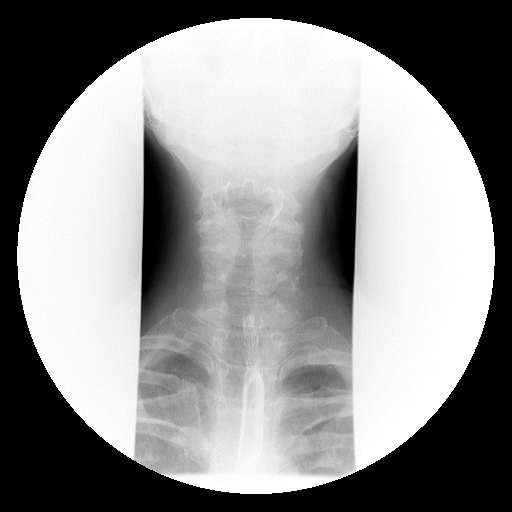

[Series 9: run · 2 of 8 slices shown (7 of 16)]
[im 1/8]
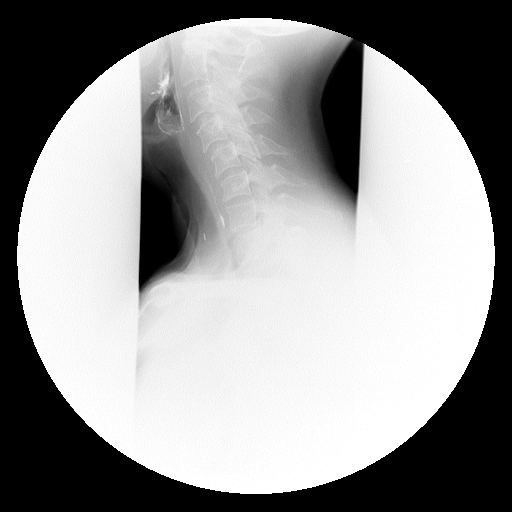
[im 8/8]
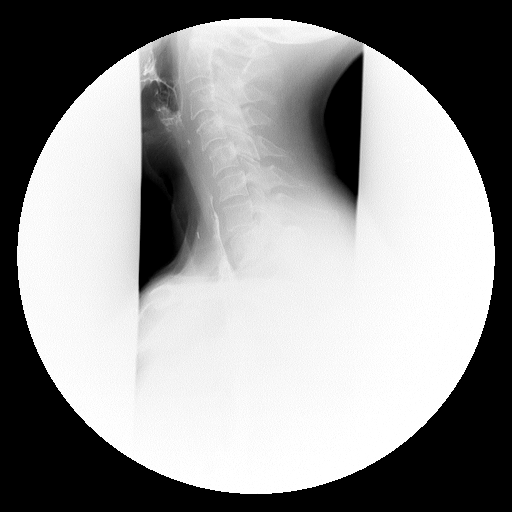

[Series 10: run · 1 of 1 slices shown (8 of 16)]
[im 1/1]
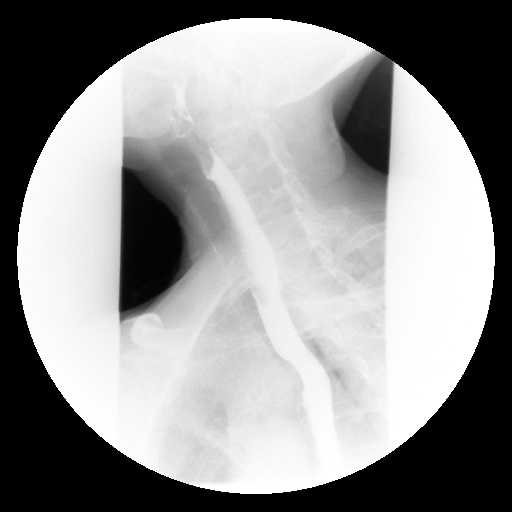

[Series 12: run · 1 of 1 slices shown (9 of 16)]
[im 1/1]
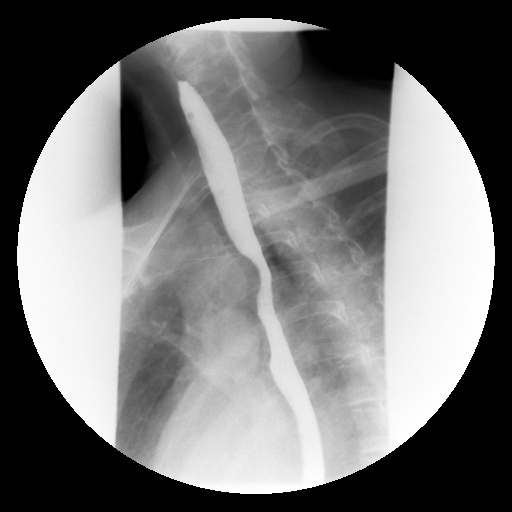

[Series 13: run · 1 of 1 slices shown (10 of 16)]
[im 1/1]
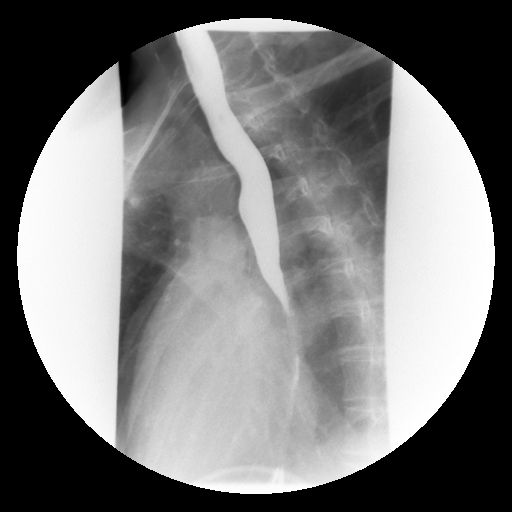

[Series 15: run · 1 of 1 slices shown (11 of 16)]
[im 1/1]
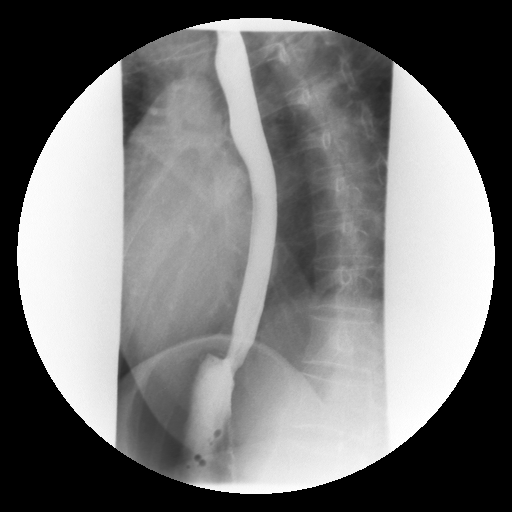

[Series 16: run · 1 of 1 slices shown (12 of 16)]
[im 1/1]
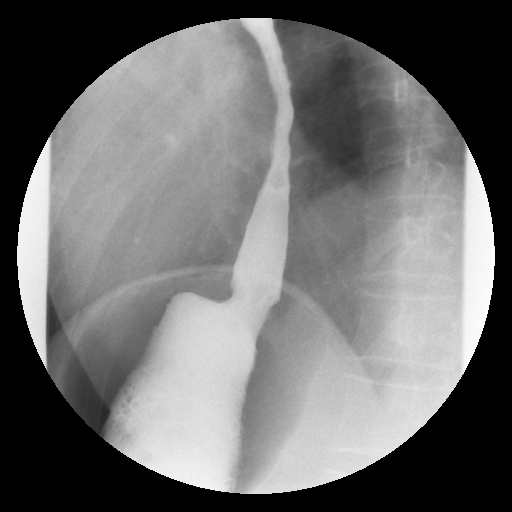

[Series 17: run · 1 of 1 slices shown (13 of 16)]
[im 1/1]
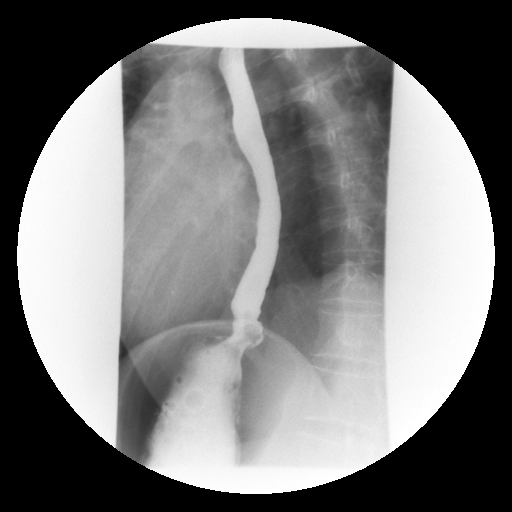

[Series 18: run · 1 of 1 slices shown (14 of 16)]
[im 1/1]
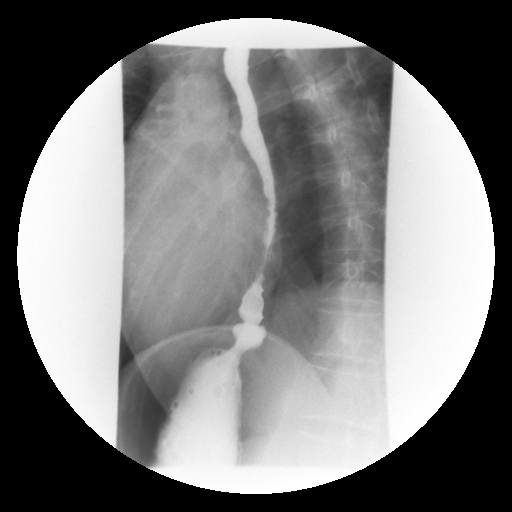

[Series 20: run · 1 of 1 slices shown (15 of 16)]
[im 1/1]
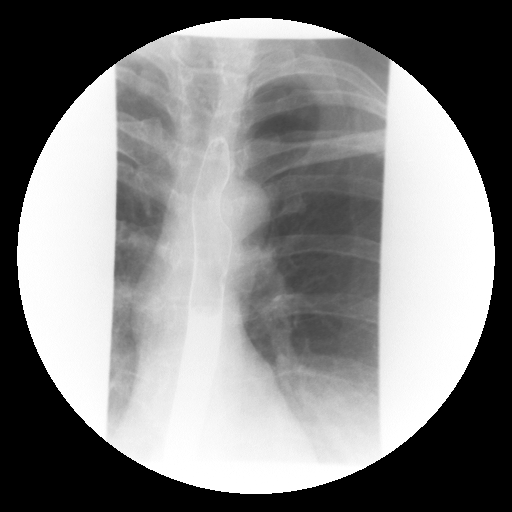

[Series 21: run · 1 of 1 slices shown (16 of 16)]
[im 1/1]
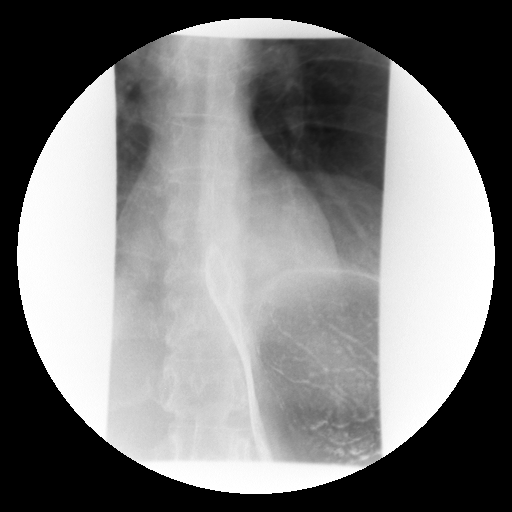

[19 of 24 positions shown; findings below may reference images not displayed]

FINDINGS: Initially a double-contrast study was performed. The mucosa of the
esophagus is unremarkable. A single contrast study shows the
swallowing mechanism to be normal. On the frontal rapid sequence
spot films of the cervical esophagus, there is lack of distension of
the upper thoracic esophagus just above the level of the aortic
knob. This could represent a very mild narrowing possibly due to
radiation treatment. Mild tertiary contractions are noted in the
distal esophagus. No hiatal hernia is seen. A barium pill was given
which did lodge at the level of the upper thoracic esophagus near
the aortic knob suggesting a mild narrowing at this level. However
after barium was given, the pill passed into the stomach without
delay. No reflux was demonstrated.
IMPRESSION: 1. Slight narrowing of the upper thoracic esophagus at and just
above the level of the aortic knob may be due to prior radiation
treatment. This is a very subtle finding where the pill does lodge
temporarily, but no mucosal irregularity at that site is evident.
2. Mild tertiary contractions in the distal esophagus.

## 2019-11-14 ENCOUNTER — Other Ambulatory Visit: Payer: Self-pay | Admitting: Nurse Practitioner

## 2019-11-14 DIAGNOSIS — U071 COVID-19: Secondary | ICD-10-CM

## 2019-11-14 NOTE — Progress Notes (Signed)
I connected by phone with Mia Hebert on 11/14/2019 at 12:50 PM to discuss the potential use of a new treatment for mild to moderate COVID-19 viral infection in non-hospitalized patients.  This patient is a 64 y.o. female that meets the FDA criteria for Emergency Use Authorization of COVID monoclonal antibody casirivimab/imdevimab or bamlanivimab/eteseviamb.  Has a (+) direct SARS-CoV-2 viral test result  Has mild or moderate COVID-19   Is NOT hospitalized due to COVID-19  Is within 10 days of symptom onset  Has at least one of the high risk factor(s) for progression to severe COVID-19 and/or hospitalization as defined in EUA.  Specific high risk criteria : BMI > 25 and Immunosuppressive Disease or Treatment   I have spoken and communicated the following to the patient or parent/caregiver regarding COVID monoclonal antibody treatment:  1. FDA has authorized the emergency use for the treatment of mild to moderate COVID-19 in adults and pediatric patients with positive results of direct SARS-CoV-2 viral testing who are 37 years of age and older weighing at least 40 kg, and who are at high risk for progressing to severe COVID-19 and/or hospitalization.  2. The significant known and potential risks and benefits of COVID monoclonal antibody, and the extent to which such potential risks and benefits are unknown.  3. Information on available alternative treatments and the risks and benefits of those alternatives, including clinical trials.  4. Patients treated with COVID monoclonal antibody should continue to self-isolate and use infection control measures (e.g., wear mask, isolate, social distance, avoid sharing personal items, clean and disinfect "high touch" surfaces, and frequent handwashing) according to CDC guidelines.   5. The patient or parent/caregiver has the option to accept or refuse COVID monoclonal antibody treatment.  After reviewing this information with the patient, the  patient has agreed to receive one of the available covid 19 monoclonal antibodies and will be provided an appropriate fact sheet prior to infusion. Jobe Gibbon, NP 11/14/2019 12:50 PM

## 2019-11-15 ENCOUNTER — Other Ambulatory Visit (HOSPITAL_COMMUNITY): Payer: Self-pay

## 2019-11-15 ENCOUNTER — Ambulatory Visit (HOSPITAL_COMMUNITY)
Admission: RE | Admit: 2019-11-15 | Discharge: 2019-11-15 | Disposition: A | Discharge status: Home / Self Care | Payer: 59 | Source: Ambulatory Visit | Attending: Pulmonary Disease | Admitting: Pulmonary Disease

## 2019-11-15 DIAGNOSIS — U071 COVID-19: Secondary | ICD-10-CM | POA: Diagnosis not present

## 2019-11-15 MED ORDER — DIPHENHYDRAMINE HCL 50 MG/ML IJ SOLN: 50.0000 mg | Freq: Once | INTRAMUSCULAR | Status: DC | PRN

## 2019-11-15 MED ORDER — EPINEPHRINE 0.3 MG/0.3ML IJ SOAJ: 0.3000 mg | Freq: Once | INTRAMUSCULAR | Status: DC | PRN

## 2019-11-15 MED ORDER — SODIUM CHLORIDE 0.9 % IV SOLN: INTRAVENOUS | Status: DC | PRN

## 2019-11-15 MED ORDER — CASIRIVIMAB-IMDEVIMAB 600-600 MG/10ML IJ SOLN
1200.0000 mg | Freq: Once | INTRAMUSCULAR | Status: AC
  Administered 2019-11-15: 13:00:00 1200 mg via INTRAVENOUS

## 2019-11-15 MED ORDER — FAMOTIDINE IN NACL 20-0.9 MG/50ML-% IV SOLN: 20.0000 mg | Freq: Once | INTRAVENOUS | Status: DC | PRN

## 2019-11-15 MED ORDER — METHYLPREDNISOLONE SODIUM SUCC 125 MG IJ SOLR: 125.0000 mg | Freq: Once | INTRAMUSCULAR | Status: DC | PRN

## 2019-11-15 MED ORDER — ALBUTEROL SULFATE HFA 108 (90 BASE) MCG/ACT IN AERS: 2.0000 | INHALATION_SPRAY | Freq: Once | RESPIRATORY_TRACT | Status: DC | PRN

## 2019-11-15 NOTE — Discharge Instructions (Signed)
COVID-19 COVID-19 is a respiratory infection that is caused by a virus called severe acute respiratory syndrome coronavirus 2 (SARS-CoV-2). The disease is also known as coronavirus disease or novel coronavirus. In some people, the virus may not cause any symptoms. In others, it may cause a serious infection. The infection can get worse quickly and can lead to complications, such as:  Pneumonia, or infection of the lungs.  Acute respiratory distress syndrome or ARDS. This is a condition in which fluid build-up in the lungs prevents the lungs from filling with air and passing oxygen into the blood.  Acute respiratory failure. This is a condition in which there is not enough oxygen passing from the lungs to the body or when carbon dioxide is not passing from the lungs out of the body.  Sepsis or septic shock. This is a serious bodily reaction to an infection.  Blood clotting problems.  Secondary infections due to bacteria or fungus.  Organ failure. This is when your body's organs stop working. The virus that causes COVID-19 is contagious. This means that it can spread from person to person through droplets from coughs and sneezes (respiratory secretions). What are the causes? This illness is caused by a virus. You may catch the virus by:  Breathing in droplets from an infected person. Droplets can be spread by a person breathing, speaking, singing, coughing, or sneezing.  Touching something, like a table or a doorknob, that was exposed to the virus (contaminated) and then touching your mouth, nose, or eyes. What increases the risk? Risk for infection You are more likely to be infected with this virus if you:  Are within 6 feet (2 meters) of a person with COVID-19.  Provide care for or live with a person who is infected with COVID-19.  Spend time in crowded indoor spaces or live in shared housing. Risk for serious illness You are more likely to become seriously ill from the virus if  you:  Are 50 years of age or older. The higher your age, the more you are at risk for serious illness.  Live in a nursing home or long-term care facility.  Have cancer.  Have a long-term (chronic) disease such as: ? Chronic lung disease, including chronic obstructive pulmonary disease or asthma. ? A long-term disease that lowers your body's ability to fight infection (immunocompromised). ? Heart disease, including heart failure, a condition in which the arteries that lead to the heart become narrow or blocked (coronary artery disease), a disease which makes the heart muscle thick, weak, or stiff (cardiomyopathy). ? Diabetes. ? Chronic kidney disease. ? Sickle cell disease, a condition in which red blood cells have an abnormal "sickle" shape. ? Liver disease.  Are obese. What are the signs or symptoms? Symptoms of this condition can range from mild to severe. Symptoms may appear any time from 2 to 14 days after being exposed to the virus. They include:  A fever or chills.  A cough.  Difficulty breathing.  Headaches, body aches, or muscle aches.  Runny or stuffy (congested) nose.  A sore throat.  New loss of taste or smell. Some people may also have stomach problems, such as nausea, vomiting, or diarrhea. Other people may not have any symptoms of COVID-19. How is this diagnosed? This condition may be diagnosed based on:  Your signs and symptoms, especially if: ? You live in an area with a COVID-19 outbreak. ? You recently traveled to or from an area where the virus is common. ? You   provide care for or live with a person who was diagnosed with COVID-19. ? You were exposed to a person who was diagnosed with COVID-19.  A physical exam.  Lab tests, which may include: ? Taking a sample of fluid from the back of your nose and throat (nasopharyngeal fluid), your nose, or your throat using a swab. ? A sample of mucus from your lungs (sputum). ? Blood tests.  Imaging tests,  which may include, X-rays, CT scan, or ultrasound. How is this treated? At present, there is no medicine to treat COVID-19. Medicines that treat other diseases are being used on a trial basis to see if they are effective against COVID-19. Your health care provider will talk with you about ways to treat your symptoms. For most people, the infection is mild and can be managed at home with rest, fluids, and over-the-counter medicines. Treatment for a serious infection usually takes places in a hospital intensive care unit (ICU). It may include one or more of the following treatments. These treatments are given until your symptoms improve.  Receiving fluids and medicines through an IV.  Supplemental oxygen. Extra oxygen is given through a tube in the nose, a face mask, or a hood.  Positioning you to lie on your stomach (prone position). This makes it easier for oxygen to get into the lungs.  Continuous positive airway pressure (CPAP) or bi-level positive airway pressure (BPAP) machine. This treatment uses mild air pressure to keep the airways open. A tube that is connected to a motor delivers oxygen to the body.  Ventilator. This treatment moves air into and out of the lungs by using a tube that is placed in your windpipe.  Tracheostomy. This is a procedure to create a hole in the neck so that a breathing tube can be inserted.  Extracorporeal membrane oxygenation (ECMO). This procedure gives the lungs a chance to recover by taking over the functions of the heart and lungs. It supplies oxygen to the body and removes carbon dioxide. Follow these instructions at home: Lifestyle  If you are sick, stay home except to get medical care. Your health care provider will tell you how long to stay home. Call your health care provider before you go for medical care.  Rest at home as told by your health care provider.  Do not use any products that contain nicotine or tobacco, such as cigarettes,  e-cigarettes, and chewing tobacco. If you need help quitting, ask your health care provider.  Return to your normal activities as told by your health care provider. Ask your health care provider what activities are safe for you. General instructions  Take over-the-counter and prescription medicines only as told by your health care provider.  Drink enough fluid to keep your urine pale yellow.  Keep all follow-up visits as told by your health care provider. This is important. How is this prevented?  There is no vaccine to help prevent COVID-19 infection. However, there are steps you can take to protect yourself and others from this virus. To protect yourself:   Do not travel to areas where COVID-19 is a risk. The areas where COVID-19 is reported change often. To identify high-risk areas and travel restrictions, check the CDC travel website: wwwnc.cdc.gov/travel/notices  If you live in, or must travel to, an area where COVID-19 is a risk, take precautions to avoid infection. ? Stay away from people who are sick. ? Wash your hands often with soap and water for 20 seconds. If soap and water   are not available, use an alcohol-based hand sanitizer. ? Avoid touching your mouth, face, eyes, or nose. ? Avoid going out in public, follow guidance from your state and local health authorities. ? If you must go out in public, wear a cloth face covering or face mask. Make sure your mask covers your nose and mouth. ? Avoid crowded indoor spaces. Stay at least 6 feet (2 meters) away from others. ? Disinfect objects and surfaces that are frequently touched every day. This may include:  Counters and tables.  Doorknobs and light switches.  Sinks and faucets.  Electronics, such as phones, remote controls, keyboards, computers, and tablets. To protect others: If you have symptoms of COVID-19, take steps to prevent the virus from spreading to others.  If you think you have a COVID-19 infection, contact  your health care provider right away. Tell your health care team that you think you may have a COVID-19 infection.  Stay home. Leave your house only to seek medical care. Do not use public transport.  Do not travel while you are sick.  Wash your hands often with soap and water for 20 seconds. If soap and water are not available, use alcohol-based hand sanitizer.  Stay away from other members of your household. Let healthy household members care for children and pets, if possible. If you have to care for children or pets, wash your hands often and wear a mask. If possible, stay in your own room, separate from others. Use a different bathroom.  Make sure that all people in your household wash their hands well and often.  Cough or sneeze into a tissue or your sleeve or elbow. Do not cough or sneeze into your hand or into the air.  Wear a cloth face covering or face mask. Make sure your mask covers your nose and mouth. Where to find more information  Centers for Disease Control and Prevention: www.cdc.gov/coronavirus/2019-ncov/index.html  World Health Organization: www.who.int/health-topics/coronavirus Contact a health care provider if:  You live in or have traveled to an area where COVID-19 is a risk and you have symptoms of the infection.  You have had contact with someone who has COVID-19 and you have symptoms of the infection. Get help right away if:  You have trouble breathing.  You have pain or pressure in your chest.  You have confusion.  You have bluish lips and fingernails.  You have difficulty waking from sleep.  You have symptoms that get worse. These symptoms may represent a serious problem that is an emergency. Do not wait to see if the symptoms will go away. Get medical help right away. Call your local emergency services (911 in the [place] Do not drive yourself to the hospital. Let the emergency medical personnel know if you think you have  COVID-19. Summary  COVID-19 is a respiratory infection that is caused by a virus. It is also known as coronavirus disease or novel coronavirus. It can cause serious infections, such as pneumonia, acute respiratory distress syndrome, acute respiratory failure, or sepsis.  The virus that causes COVID-19 is contagious. This means that it can spread from person to person through droplets from breathing, speaking, singing, coughing, or sneezing.  You are more likely to develop a serious illness if you are 50 years of age or older, have a weak immune system, live in a nursing home, or have chronic disease.  There is no medicine to treat COVID-19. Your health care provider will talk with you about ways to treat your symptoms.    Take steps to protect yourself and others from infection. Wash your hands often and disinfect objects and surfaces that are frequently touched every day. Stay away from people who are sick and wear a mask if you are sick. This information is not intended to replace advice given to you by your health care provider. Make sure you discuss any questions you have with your health care provider. Document Revised: 11/28/2018 Document Reviewed: 03/06/2018 Elsevier Patient Education  2020 [employer] What types of side effects do monoclonal antibody drugs cause?  Common side effects  In general, the more common side effects caused by monoclonal antibody drugs include: . Allergic reactions, such as hives or itching . Flu-like signs and symptoms, including chills, fatigue, fever, and muscle aches and pains . Nausea, vomiting . Diarrhea . Skin rashes . Low blood pressure   The CDC is recommending patients who receive monoclonal antibody treatments wait at least 90 days before being vaccinated.  Currently, there are no data on the safety and efficacy of mRNA COVID-19 vaccines in persons who received monoclonal antibodies or convalescent plasma as part of COVID-19 treatment. Based  on the estimated half-life of such therapies as well as evidence suggesting that reinfection is uncommon in the 90 days after initial infection, vaccination should be deferred for at least 90 days, as a precautionary measure until additional information becomes available, to avoid interference of the antibody treatment with vaccine-induced immune responses. 

## 2019-11-15 NOTE — Progress Notes (Signed)
Patient ID: Mia Hebert, female   DOB: 08-May-1955, 64 y.o.   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  Complications: No immediate complications noted.  Discharge: Discharged home   [place] 11/15/2019

## 2024-01-20 ENCOUNTER — Encounter: Payer: Self-pay | Admitting: Plastic Surgery

## 2024-01-20 ENCOUNTER — Ambulatory Visit: Payer: Self-pay | Admitting: Plastic Surgery

## 2024-01-20 VITALS — BP 163/77 | HR 76 | Ht 65.0 in | Wt 127.8 lb

## 2024-01-20 DIAGNOSIS — Z719 Counseling, unspecified: Secondary | ICD-10-CM | POA: Insufficient documentation

## 2024-01-20 NOTE — Progress Notes (Signed)
 Patient ID: Mia Hebert, female    DOB: 1955/12/18, 68 y.o.   MRN: [id number]   Chief Complaint  Patient presents with   Advice Only    The patient is a 68 year old female here for evaluation of her face.  Patient states she has had quite a bit of sun exposure over the years.  She has several areas of hyperpigmentation and some rhytids.  I do not see any areas of concern.  She has had some past medical issues but at the moment they are all stable.  She is interested in some rejuvenation.    Review of Systems  Constitutional: Negative.   Eyes: Negative.   Respiratory: Negative.    Cardiovascular: Negative.   Gastrointestinal: Negative.   Genitourinary: Negative.     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  Dr. Arlee).   Hay fever    History of thyroid  cancer 2007, 2008   Papillary thyroid  carcinoma s/p radiothyroidectomy.  Recurrence 2008--neck dissection only.   HX: breast cancer 02/2009   Ductal carcinoma with lobular features.  Bilateral mastectomy (left breast w/out malignancy) and reconstruction 2011-12.  Chemo + radiation.   Osteopenia 02/15/10   DEXA: FRAX score indicating >25% fracture risk + previous rib fracture.  Prolia initiated 03/14/10.    Past Surgical History:  Procedure Laterality Date   BREAST RECONSTRUCTION  2012   DILATION AND CURETTAGE OF UTERUS     DILATION AND CURETTAGE OF UTERUS N/A 11/24/2012   Procedure: DILATATION AND CURETTAGE;  Surgeon: Hargis Paradise, MD;  Location: [facility];  Service: Gynecology;  Laterality: N/A;   MASTECTOMY  2011   Bilateral   NECK SURGERY     removed lymph nodes under colar bone - cancer   THYROIDECTOMY  2007   +neck dissection.  Neck dissection again when her cancer recurred in 2008.      Current Outpatient Medications:    calcium-vitamin D (OSCAL WITH D) 500-200 MG-UNIT per tablet, Take 1 tablet by mouth  daily., Disp: , Rfl:    co-enzyme Q-10 30 MG capsule, Take 30 mg by mouth daily., Disp: , Rfl:    letrozole (FEMARA) 2.5 MG tablet, Take 2.5 mg by mouth daily., Disp: , Rfl:    levothyroxine (SYNTHROID, LEVOTHROID) 112 MCG tablet, Take 112 mcg by mouth daily before breakfast., Disp: , Rfl:    Objective:   Vitals:   01/20/24 1232  BP: (!) 163/77  Pulse: 76  SpO2: 98%    Physical Exam Constitutional:      Appearance: She is obese.  Cardiovascular:     Rate and Rhythm: Normal rate.  Musculoskeletal:        General: No swelling.  Skin:    General: Skin is warm.     Capillary Refill: Capillary refill takes less than 2 seconds.     Coloration: Skin is not jaundiced.     Findings: No bruising or lesion.  Neurological:     Mental Status: She is alert and oriented to person, place, and time.  Psychiatric:        Mood and Affect: Mood normal.        Behavior: Behavior normal.        Thought Content: Thought content normal.        Judgment: Judgment normal.     Assessment & Plan:  Encounter for counseling  The  patient is a very good candidate for starting with BBL and then moving onto the Moxi or the halo.  I am also going to get her set up with [facility] for consultation.  I think she would have really good results especially since she does not like to wear make up. Patient will let me know if she wants to start the laser.  Pictures were obtained of the patient and placed in the chart with the patient's or guardian's permission.   Mia RAMAN Hitomi Slape, DO

## 2024-03-23 ENCOUNTER — Other Ambulatory Visit: Payer: Self-pay | Admitting: Plastic Surgery
